# Patient Record
Sex: Male | Born: 1995 | Race: White | Hispanic: No | Marital: Single | Smoking: Never smoker
Health system: Southern US, Community
[De-identification: ages and names within clinical notes are randomized; demographics above are authoritative.]

## PROBLEM LIST (undated history)

## (undated) DIAGNOSIS — J45909 Unspecified asthma, uncomplicated: Secondary | ICD-10-CM

---
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
## 2018-07-28 ENCOUNTER — Emergency Department: Payer: Medicaid Other

## 2018-07-28 ENCOUNTER — Encounter: Payer: Self-pay | Admitting: Intensive Care

## 2018-07-28 ENCOUNTER — Other Ambulatory Visit: Payer: Self-pay

## 2018-07-28 ENCOUNTER — Emergency Department
Admission: EM | Admit: 2018-07-28 | Discharge: 2018-07-28 | Disposition: A | Discharge status: Home / Self Care | Payer: Medicaid Other | Attending: Student in an Organized Health Care Education/Training Program | Admitting: Student in an Organized Health Care Education/Training Program

## 2018-07-28 DIAGNOSIS — E860 Dehydration: Secondary | ICD-10-CM | POA: Diagnosis not present

## 2018-07-28 DIAGNOSIS — J45909 Unspecified asthma, uncomplicated: Secondary | ICD-10-CM | POA: Diagnosis not present

## 2018-07-28 DIAGNOSIS — R112 Nausea with vomiting, unspecified: Secondary | ICD-10-CM | POA: Diagnosis not present

## 2018-07-28 DIAGNOSIS — R1011 Right upper quadrant pain: Secondary | ICD-10-CM | POA: Diagnosis present

## 2018-07-28 LAB — CBC
HCT: 46.4 % (ref 39.0–52.0)
Hemoglobin: 15.5 g/dL (ref 13.0–17.0)
MCH: 29.8 pg (ref 26.0–34.0)
MCHC: 33.4 g/dL (ref 30.0–36.0)
MCV: 89.2 fL (ref 80.0–100.0)
Platelets: 286 10*3/uL (ref 150–400)
RBC: 5.2 MIL/uL (ref 4.22–5.81)
RDW: 12.1 % (ref 11.5–15.5)
WBC: 11.5 10*3/uL — ABNORMAL HIGH (ref 4.0–10.5)
nRBC: 0 % (ref 0.0–0.2)

## 2018-07-28 LAB — COMPREHENSIVE METABOLIC PANEL
ALT: 19 U/L (ref 0–44)
AST: 19 U/L (ref 15–41)
Albumin: 5.4 g/dL — ABNORMAL HIGH (ref 3.5–5.0)
Alkaline Phosphatase: 28 U/L — ABNORMAL LOW (ref 38–126)
Anion gap: 15 (ref 5–15)
BUN: 13 mg/dL (ref 6–20)
CO2: 21 mmol/L — ABNORMAL LOW (ref 22–32)
Calcium: 9.9 mg/dL (ref 8.9–10.3)
Chloride: 102 mmol/L (ref 98–111)
Creatinine, Ser: 0.84 mg/dL (ref 0.61–1.24)
GFR calc Af Amer: >60 mL/min (ref >60)
GFR calc non Af Amer: >60 mL/min (ref >60)
Glucose, Bld: 108 mg/dL — ABNORMAL HIGH (ref 70–99)
Potassium: 3.9 mmol/L (ref 3.5–5.1)
Sodium: 138 mmol/L (ref 135–145)
Total Bilirubin: 1.8 mg/dL — ABNORMAL HIGH (ref 0.3–1.2)
Total Protein: 8.9 g/dL — ABNORMAL HIGH (ref 6.5–8.1)

## 2018-07-28 LAB — URINALYSIS, COMPLETE (UACMP) WITH MICROSCOPIC
Bacteria, UA: NONE SEEN
Bilirubin Urine: NEGATIVE
Glucose, UA: NEGATIVE mg/dL
Hgb urine dipstick: NEGATIVE
Ketones, ur: 80 mg/dL — AB
Leukocytes,Ua: NEGATIVE
Nitrite: NEGATIVE
Protein, ur: 30 mg/dL — AB
Specific Gravity, Urine: 1.026 (ref 1.005–1.030)
pH: 6 (ref 5.0–8.0)

## 2018-07-28 LAB — CK: Total CK: 117 U/L (ref 49–397)

## 2018-07-28 LAB — LIPASE, BLOOD: Lipase: 27 U/L (ref 11–51)

## 2018-07-28 MED ORDER — ONDANSETRON HCL 4 MG/2ML IJ SOLN
4.0000 mg | Freq: Once | INTRAMUSCULAR | Status: AC
  Administered 2018-07-28: 4 mg via INTRAVENOUS
  Filled 2018-07-28: qty 2

## 2018-07-28 MED ORDER — PROMETHAZINE HCL 12.5 MG PO TABS: 12.5000 mg | ORAL_TABLET | Freq: Four times a day (QID) | ORAL | 0 refills | Status: AC | PRN

## 2018-07-28 MED ORDER — PROMETHAZINE HCL 25 MG/ML IJ SOLN
12.5000 mg | Freq: Four times a day (QID) | INTRAMUSCULAR | Status: DC | PRN
  Administered 2018-07-28: 12.5 mg via INTRAVENOUS
  Filled 2018-07-28: qty 1

## 2018-07-28 MED ORDER — SODIUM CHLORIDE 0.9 % IV BOLUS
1000.0000 mL | Freq: Once | INTRAVENOUS | Status: AC
  Administered 2018-07-28: 1000 mL via INTRAVENOUS

## 2018-07-28 MED ORDER — DEXTROSE 5 % AND 0.9 % NACL IV BOLUS
1000.0000 mL | Freq: Once | INTRAVENOUS | Status: AC
  Administered 2018-07-28: 1000 mL via INTRAVENOUS
  Filled 2018-07-28: qty 1000

## 2018-07-28 NOTE — ED Provider Notes (Signed)
[facility] Emergency Department Provider Note    First MD Initiated Contact with Patient 07/28/18 1357     (approximate)  I have reviewed the triage vital signs and the nursing notes.   HISTORY  Chief Complaint Abdominal Pain    HPI Terry Black is a 23 y.o. male presents the ER for nausea vomiting crampy abdominal pain.  Patient feels that he got "heat stroke "over the weekend.  Was working out in [employer].  Felt he got overheated and started having crampy abdominal pain and nausea.  He then tried again yesterday to work outside and started feeling worse.  Since then has been having trouble keeping anything down.  States he will have intermittently cramping abdominal pain that is periumbilical.  No radiation of pain denies any pain right now.  States he just feels very dehydrated.  Denies any significant past medical history.  Specifically does not have any right upper quadrant or right lower quadrant abdominal pain.  Has not taken anything for nausea at home.   Past Medical History:  Diagnosis Date  . Asthma    History reviewed. No pertinent family history. History reviewed. No pertinent surgical history. There are no active problems to display for this patient.     Prior to Admission medications   Medication Sig Start Date End Date Taking? Authorizing Provider  promethazine (PHENERGAN) 12.5 MG tablet Take 1 tablet (12.5 mg total) by mouth every 6 (six) hours as needed. 07/28/18   Merlyn Lot, MD    Allergies Amoxicillin    Social History Social History   Tobacco Use  . Smoking status: Never Smoker  . Smokeless tobacco: Never Used  Substance Use Topics  . Alcohol use: Never    Frequency: Never  . Drug use: Never    Review of Systems Patient denies headaches, rhinorrhea, blurry vision, numbness, shortness of breath, chest pain, edema, cough, abdominal pain, nausea, vomiting, diarrhea, dysuria, fevers, rashes or hallucinations  unless otherwise stated above in HPI. ____________________________________________   PHYSICAL EXAM:  VITAL SIGNS: Vitals:   07/28/18 1310 07/28/18 1400  BP:  (!) 143/81  Pulse:  61  Resp:    Temp: 98.7 F (37.1 C)   SpO2:  99%    Constitutional: Alert and oriented.  Eyes: Conjunctivae are normal.  Head: Atraumatic. Nose: No congestion/rhinnorhea. Mouth/Throat: Mucous membranes are moist.   Neck: No stridor. Painless ROM.  Cardiovascular: Normal rate, regular rhythm. Grossly normal heart sounds.  Good peripheral circulation. Respiratory: Normal respiratory effort.  No retractions. Lungs CTAB. Gastrointestinal: Soft and nontender. No distention. No abdominal bruits. No CVA tenderness. Genitourinary:  Musculoskeletal: No lower extremity tenderness nor edema.  No joint effusions. Neurologic:  Normal speech and language. No gross focal neurologic deficits are appreciated. No facial droop Skin:  Skin is warm, dry and intact. No rash noted. Psychiatric: Mood and affect are normal. Speech and behavior are normal.  ____________________________________________   LABS (all labs ordered are listed, but only abnormal results are displayed)  Results for orders placed or performed during the hospital encounter of 07/28/18 (from the past 24 hour(s))  Lipase, blood     Status: None   Collection Time: 07/28/18  1:08 PM  Result Value Ref Range   Lipase 27 11 - 51 U/L  Comprehensive metabolic panel     Status: Abnormal   Collection Time: 07/28/18  1:08 PM  Result Value Ref Range   Sodium 138 135 - 145 mmol/L   Potassium 3.9 3.5 -  5.1 mmol/L   Chloride 102 98 - 111 mmol/L   CO2 21 (L) 22 - 32 mmol/L   Glucose, Bld 108 (H) 70 - 99 mg/dL   BUN 13 6 - 20 mg/dL   Creatinine, Ser 9.600.84 0.61 - 1.24 mg/dL   Calcium 9.9 8.9 - 45.410.3 mg/dL   Total Protein 8.9 (H) 6.5 - 8.1 g/dL   Albumin 5.4 (H) 3.5 - 5.0 g/dL   AST 19 15 - 41 U/L   ALT 19 0 - 44 U/L   Alkaline Phosphatase 28 (L) 38 - 126 U/L    Total Bilirubin 1.8 (H) 0.3 - 1.2 mg/dL   GFR calc non Af Amer >60 >60 mL/min   GFR calc Af Amer >60 >60 mL/min   Anion gap 15 5 - 15  CBC     Status: Abnormal   Collection Time: 07/28/18  1:08 PM  Result Value Ref Range   WBC 11.5 (H) 4.0 - 10.5 K/uL   RBC 5.20 4.22 - 5.81 MIL/uL   Hemoglobin 15.5 13.0 - 17.0 g/dL   HCT 09.846.4 11.939.0 - 14.752.0 %   MCV 89.2 80.0 - 100.0 fL   MCH 29.8 26.0 - 34.0 pg   MCHC 33.4 30.0 - 36.0 g/dL   RDW 82.912.1 56.211.5 - 13.015.5 %   Platelets 286 150 - 400 K/uL   nRBC 0.0 0.0 - 0.2 %  Urinalysis, Complete w Microscopic     Status: Abnormal   Collection Time: 07/28/18  1:08 PM  Result Value Ref Range   Color, Urine YELLOW (A) YELLOW   APPearance CLEAR (A) CLEAR   Specific Gravity, Urine 1.026 1.005 - 1.030   pH 6.0 5.0 - 8.0   Glucose, UA NEGATIVE NEGATIVE mg/dL   Hgb urine dipstick NEGATIVE NEGATIVE   Bilirubin Urine NEGATIVE NEGATIVE   Ketones, ur 80 (A) NEGATIVE mg/dL   Protein, ur 30 (A) NEGATIVE mg/dL   Nitrite NEGATIVE NEGATIVE   Leukocytes,Ua NEGATIVE NEGATIVE   RBC / HPF 0-5 0 - 5 RBC/hpf   WBC, UA 0-5 0 - 5 WBC/hpf   Bacteria, UA NONE SEEN NONE SEEN   Squamous Epithelial / LPF 0-5 0 - 5   Mucus PRESENT   CK     Status: None   Collection Time: 07/28/18  1:08 PM  Result Value Ref Range   Total CK 117 49 - 397 U/L   ____________________________________________ ______________________________  RADIOLOGY  I personally reviewed all radiographic images ordered to evaluate for the above acute complaints and reviewed radiology reports and findings.  These findings were personally discussed with the patient.  Please see medical record for radiology report.  ____________________________________________   PROCEDURES  Procedure(s) performed:  Procedures    Critical Care performed: no ____________________________________________   INITIAL IMPRESSION / ASSESSMENT AND PLAN / ED COURSE  Pertinent labs & imaging results that were available during  my care of the patient were reviewed by me and considered in my medical decision making (see chart for details).   DDX: Dehydration, heat illness, DKA, electrolyte abnormality enteritis, cholecystitis, pancreatitis, appendicitis  Naeem T Algis GreenhouseDonathan is a 23 y.o. who presents to the ED with symptoms as described above.  Patient nontoxic afebrile hemodynamically stable but does appear dehydrated.  Blood work shows dehydration.  Will give IV fluids.  Also with ketones in his urine.  Right upper quadrant ultrasound will be ordered given his nausea as well as epigastric discomfort.  Does not seem consistent with appendicitis.  Will also evaluate mildly  elevated bilirubin.  Clinical Course as of Jul 28 1803  Tue Jul 28, 2018  1802 Patient rechecked.  Repeat abdominal exam is soft and benign.  He still having some nausea but no vomiting.  Feels significantly improved after IV fluids.  Ultrasound does not show any evidence of biliary pathology.  He is tolerating oral hydration.  At this point I do believe he stable and appropriate for outpatient follow-up.  Have discussed with the patient and available family all diagnostics and treatments performed thus far and all questions were answered to the best of my ability. The patient demonstrates understanding and agreement with plan.    [PR]    Clinical Course User Index [PR] Willy Eddyobinson, Nandan Willems, MD    The patient was evaluated in Emergency Department today for the symptoms described in the history of present illness. He/she was evaluated in the context of the global COVID-19 pandemic, which necessitated consideration that the patient might be at risk for infection with the SARS-CoV-2 virus that causes COVID-19. Institutional protocols and algorithms that pertain to the evaluation of patients at risk for COVID-19 are in a state of rapid change based on information released by regulatory bodies including the CDC and federal and state organizations. These policies and  algorithms were followed during the patient's care in the ED.  As part of my medical decision making, I reviewed the following data within the electronic MEDICAL RECORD NUMBER Nursing notes reviewed and incorporated, Labs reviewed, notes from prior ED visits and [employer] Controlled Substance Database   ____________________________________________   FINAL CLINICAL IMPRESSION(S) / ED DIAGNOSES  Final diagnoses:  Nausea and vomiting  Dehydration after exertion      NEW MEDICATIONS STARTED DURING THIS VISIT:  New Prescriptions   PROMETHAZINE (PHENERGAN) 12.5 MG TABLET    Take 1 tablet (12.5 mg total) by mouth every 6 (six) hours as needed.     Note:  This document was prepared using Dragon voice recognition software and may include unintentional dictation errors.    Willy Eddyobinson, Reshunda Strider, MD 07/28/18 [phone]

## 2018-07-28 NOTE — ED Notes (Signed)
Given fluids and encouraged pt to drink, will continue to monitor

## 2018-07-28 NOTE — ED Notes (Signed)
Pt worked outside in yard this afternoon xfew hours and states overheated and vomiting since. Pt appears fatigue. NAD noted

## 2018-07-28 NOTE — ED Notes (Signed)
Patient transported to Ultrasound 

## 2018-07-28 NOTE — ED Triage Notes (Signed)
Patient reports N/V, chills since Monday. Reports hes been working outside and has not felt himself since Monday. Some abd pain. Unable to keep fluids/solids down

## 2018-07-28 NOTE — ED Notes (Signed)
Pt keeping down fluids at this time, states he is feeling better after medications. Will continue to monitor

## 2019-11-23 ENCOUNTER — Ambulatory Visit
Admission: EM | Admit: 2019-11-23 | Discharge: 2019-11-23 | Disposition: A | Discharge status: Home / Self Care | Payer: Medicaid Other | Attending: Emergency Medicine | Admitting: Emergency Medicine

## 2019-11-23 ENCOUNTER — Other Ambulatory Visit: Payer: Self-pay

## 2019-11-23 ENCOUNTER — Telehealth (HOSPITAL_COMMUNITY): Payer: Self-pay | Admitting: Emergency Medicine

## 2019-11-23 DIAGNOSIS — R059 Cough, unspecified: Secondary | ICD-10-CM

## 2019-11-23 DIAGNOSIS — J0100 Acute maxillary sinusitis, unspecified: Secondary | ICD-10-CM | POA: Diagnosis not present

## 2019-11-23 MED ORDER — AZITHROMYCIN 250 MG PO TABS: 500.0000 mg | ORAL_TABLET | Freq: Once | ORAL | 0 refills | Status: AC

## 2019-11-23 MED ORDER — ONDANSETRON 4 MG PO TBDP: 4.0000 mg | ORAL_TABLET | Freq: Three times a day (TID) | ORAL | 0 refills | Status: AC | PRN

## 2019-11-23 MED ORDER — AZITHROMYCIN 250 MG PO TABS: 250.0000 mg | ORAL_TABLET | Freq: Every day | ORAL | 0 refills | Status: AC

## 2019-11-23 MED ORDER — BENZONATATE 100 MG PO CAPS: 100.0000 mg | ORAL_CAPSULE | Freq: Three times a day (TID) | ORAL | 0 refills | Status: AC

## 2019-11-23 NOTE — ED Triage Notes (Signed)
Pt reports having productive cough and chest congestion x2 weeks. Also reports having a headache. No known sick contacts.

## 2019-11-23 NOTE — Telephone Encounter (Signed)
Patient called with his mother (he verbalized permission for her to be on the call), and he states he vomited very shortly after taking his first dose of Azithromycin.  This Rn asked if he had eaten, patient states he has not been able to keep anything down for a few days.  Mother requested nausea medicine and replacement dose.  Spoke to [place], APP who approved, prescriptions sent to pharmacy, patient/mother updated

## 2019-11-23 NOTE — ED Provider Notes (Signed)
Renaldo Fiddler    CSN: [id number] Arrival date & time: 11/23/19  2119      History   Chief Complaint Chief Complaint  Patient presents with  . Cough    HPI Terry Black is a 24 y.o. male.   Patient presents with 2-week history of cough productive of yellow-green phlegm, sinus pressure, postnasal drip, rhinorrhea, and chest congestion.  He also reports a headache, vomiting of phlegm, mild diarrhea, and sore throat when he coughs.  He denies fever, chills, rash, shortness of breath, or other symptoms.  OTC treatment attempted at home.  His medical history includes asthma.  The history is provided by the patient.    Past Medical History:  Diagnosis Date  . Asthma     There are no problems to display for this patient.   History reviewed. No pertinent surgical history.     Home Medications    Prior to Admission medications   Medication Sig Start Date End Date Taking? Authorizing Provider  azithromycin (ZITHROMAX) 250 MG tablet Take 1 tablet (250 mg total) by mouth daily. Take first 2 tablets together, then 1 every day until finished. 11/23/19   Mickie Bail, NP  benzonatate (TESSALON) 100 MG capsule Take 1 capsule (100 mg total) by mouth every 8 (eight) hours. 11/23/19   Mickie Bail, NP  promethazine (PHENERGAN) 12.5 MG tablet Take 1 tablet (12.5 mg total) by mouth every 6 (six) hours as needed. 07/28/18   Willy Eddy, MD    Family History History reviewed. No pertinent family history.  Social History Social History   Tobacco Use  . Smoking status: Never Smoker  . Smokeless tobacco: Never Used  Substance Use Topics  . Alcohol use: Never  . Drug use: Never     Allergies   Amoxicillin and Augmentin [amoxicillin-pot clavulanate]   Review of Systems Review of Systems  Constitutional: Negative for chills and fever.  HENT: Positive for congestion, postnasal drip, rhinorrhea, sinus pressure and sore throat. Negative for ear pain.   Eyes:  Negative for pain and visual disturbance.  Respiratory: Positive for cough. Negative for shortness of breath.   Cardiovascular: Negative for chest pain and palpitations.  Gastrointestinal: Positive for diarrhea and vomiting. Negative for abdominal pain.  Genitourinary: Negative for dysuria and hematuria.  Musculoskeletal: Negative for arthralgias and back pain.  Skin: Negative for color change and rash.  Neurological: Negative for seizures and syncope.  All other systems reviewed and are negative.    Physical Exam Triage Vital Signs ED Triage Vitals  Enc Vitals Group     BP      Pulse      Resp      Temp      Temp src      SpO2      Weight      Height      Head Circumference      Peak Flow      Pain Score      Pain Loc      Pain Edu?      Excl. in GC?    No data found.  Updated Vital Signs BP 137/84   Pulse 83   Temp 99.1 F (37.3 C) (Oral)   Resp 16   Ht 6\' 6"  (1.981 m)   Wt 300 lb (136.1 kg)   SpO2 95%   BMI 34.67 kg/m   Visual Acuity Right Eye Distance:   Left Eye Distance:   Bilateral Distance:  Right Eye Near:   Left Eye Near:    Bilateral Near:     Physical Exam Vitals and nursing note reviewed.  Constitutional:      General: He is not in acute distress.    Appearance: He is well-developed.  HENT:     Head: Normocephalic and atraumatic.     Right Ear: Tympanic membrane normal.     Left Ear: Tympanic membrane normal.     Nose: Congestion and rhinorrhea present.     Mouth/Throat:     Mouth: Mucous membranes are moist.     Pharynx: Oropharynx is clear.  Eyes:     Conjunctiva/sclera: Conjunctivae normal.  Cardiovascular:     Rate and Rhythm: Normal rate and regular rhythm.     Heart sounds: Normal heart sounds.  Pulmonary:     Effort: Pulmonary effort is normal. No respiratory distress.     Breath sounds: Normal breath sounds.  Abdominal:     Palpations: Abdomen is soft.     Tenderness: There is no abdominal tenderness. There is no  guarding or rebound.  Musculoskeletal:     Cervical back: Neck supple.  Skin:    General: Skin is warm and dry.     Findings: No rash.  Neurological:     General: No focal deficit present.     Mental Status: He is alert and oriented to person, place, and time.     Gait: Gait normal.  Psychiatric:        Mood and Affect: Mood normal.        Behavior: Behavior normal.      UC Treatments / Results  Labs (all labs ordered are listed, but only abnormal results are displayed) Labs Reviewed  COVID-19, FLU A+B AND RSV    EKG   Radiology No results found.  Procedures Procedures (including critical care time)  Medications Ordered in UC Medications - No data to display  Initial Impression / Assessment and Plan / UC Course  I have reviewed the triage vital signs and the nursing notes.  Pertinent labs & imaging results that were available during my care of the patient were reviewed by me and considered in my medical decision making (see chart for details).   Acute sinusitis, cough.  Treating with Zithromax and Tessalon Perles.  Discussed other symptomatic treatment including Tylenol or ibuprofen, rest, hydration.  Influenza, RSV, COVID pending.  Instructed patient to self quarantine until the test result is back.  Instructed patient to follow up with PCP if his symptoms are not improving  Patient agrees to plan of care.    Final Clinical Impressions(s) / UC Diagnoses   Final diagnoses:  Acute non-recurrent maxillary sinusitis  Cough     Discharge Instructions     Take the Zithromax and Tessalon Perles as directed.    Your COVID and Flu tests are pending.  You should self quarantine until the test results are back.    Take Tylenol or ibuprofen as needed for fever or discomfort.  Rest and keep yourself hydrated.    Follow-up with your primary care provider if your symptoms are not improving.        ED Prescriptions    Medication Sig Dispense Auth. Provider    benzonatate (TESSALON) 100 MG capsule Take 1 capsule (100 mg total) by mouth every 8 (eight) hours. 21 capsule Mickie Bail, NP   azithromycin (ZITHROMAX) 250 MG tablet Take 1 tablet (250 mg total) by mouth daily. Take first 2 tablets together,  then 1 every day until finished. 6 tablet Mickie Bail, NP     PDMP not reviewed this encounter.   Mickie Bail, NP 11/23/19 1034

## 2019-11-23 NOTE — Discharge Instructions (Addendum)
Take the Zithromax and Tessalon Perles as directed.    Your COVID and Flu tests are pending.  You should self quarantine until the test results are back.    Take Tylenol or ibuprofen as needed for fever or discomfort.  Rest and keep yourself hydrated.    Follow-up with your primary care provider if your symptoms are not improving.

## 2019-11-24 LAB — COVID-19, FLU A+B AND RSV
Influenza A, NAA: NOT DETECTED
Influenza B, NAA: NOT DETECTED
RSV, NAA: NOT DETECTED
SARS-CoV-2, NAA: DETECTED — AB

## 2020-08-04 IMAGING — US ULTRASOUND ABDOMEN LIMITED
1 series · 14 of 25 positions shown · non-contrast
Comparison: None.

CLINICAL DATA: Nausea and vomiting.

EXAM:
ULTRASOUND ABDOMEN LIMITED RIGHT UPPER QUADRANT

[Series 1: ultrasound abdomen limited · 14 of 32 slices shown]
[im 1/32]
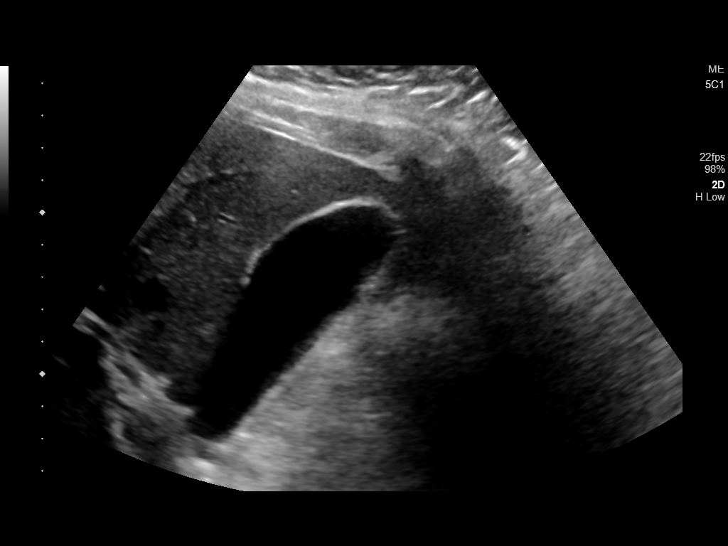
[im 3/32]
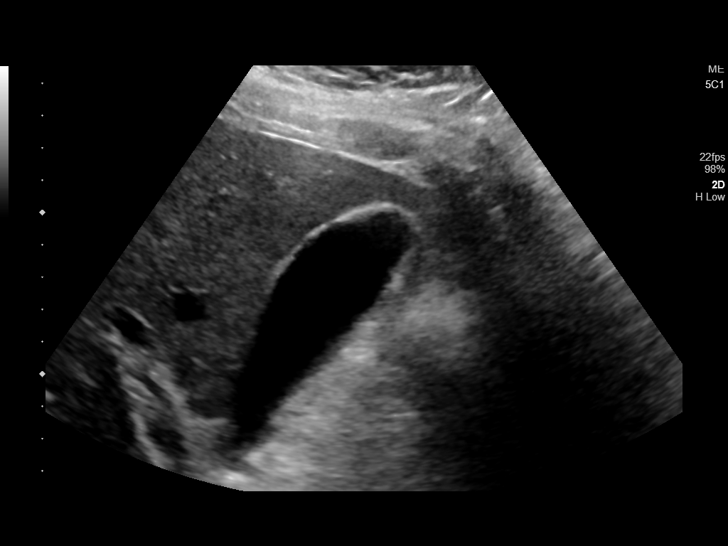
[im 6/32]
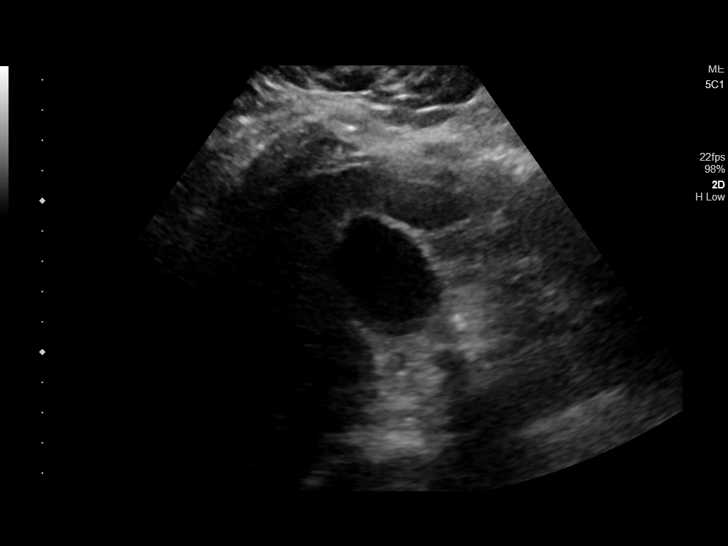
[im 8/32]
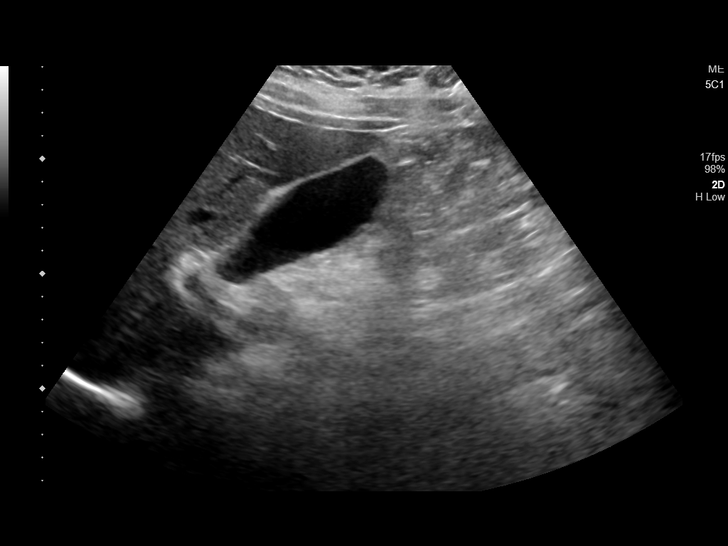
[im 11/32]
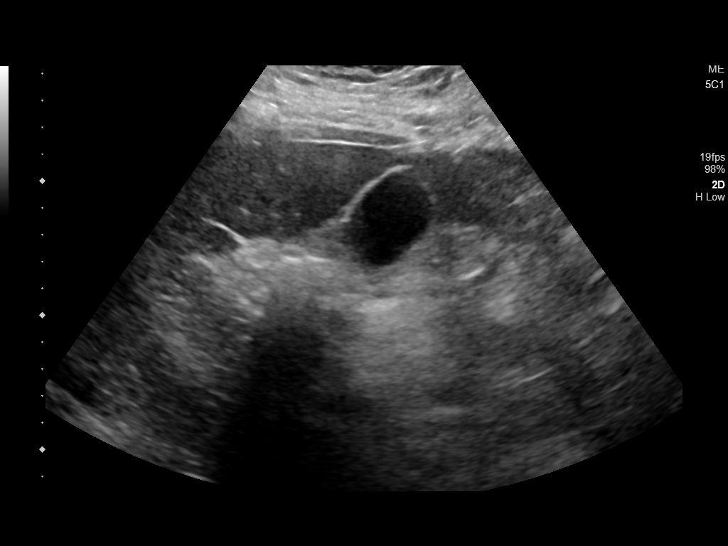
[im 12/32]
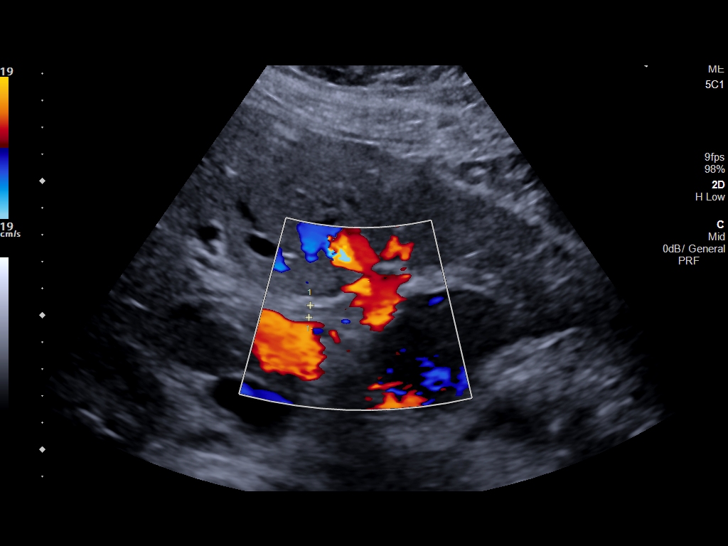
[im 15/32]
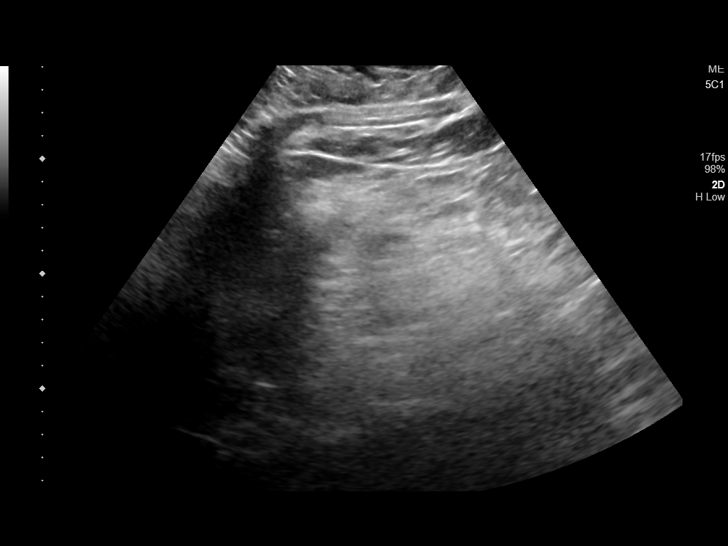
[im 17/32]
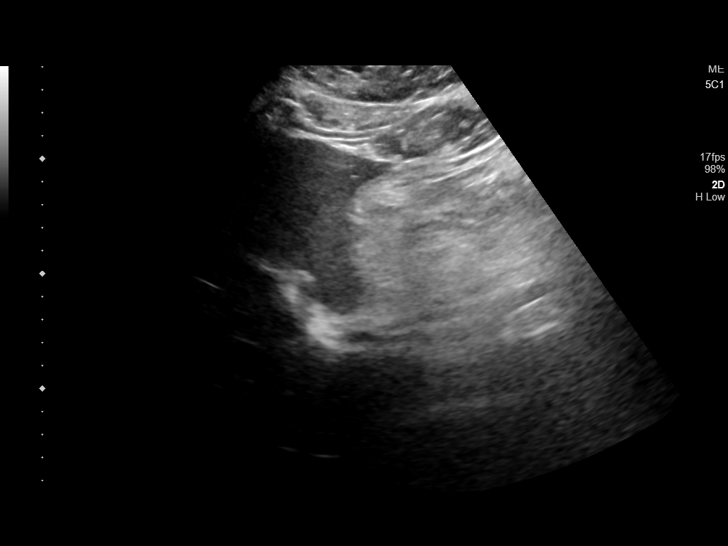
[im 20/32]
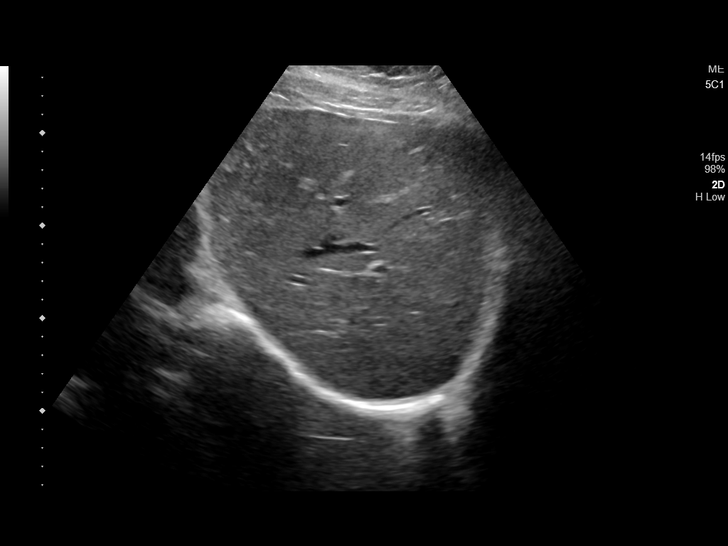
[im 21/32]
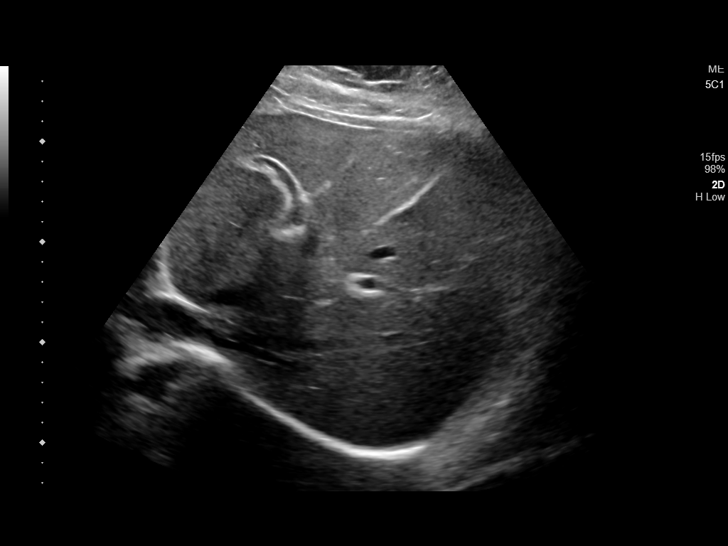
[im 24/32]
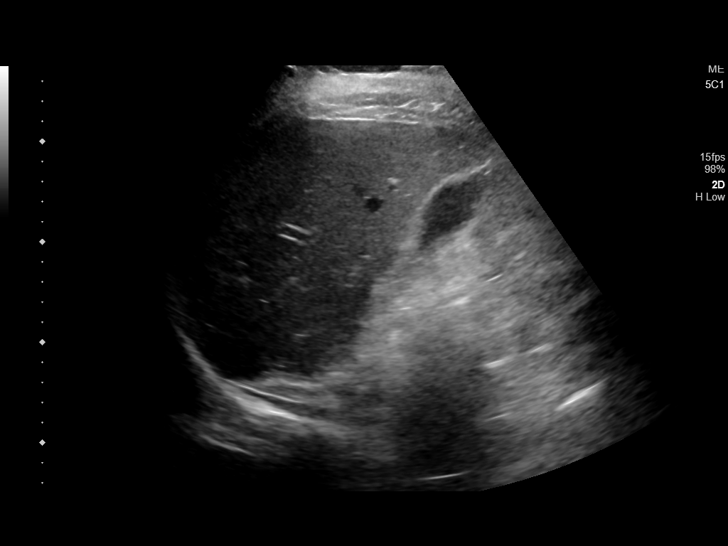
[im 26/32]
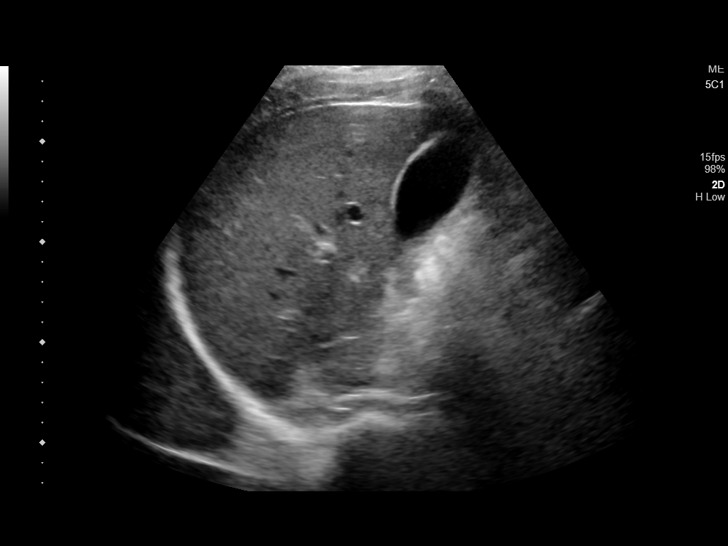
[im 29/32]
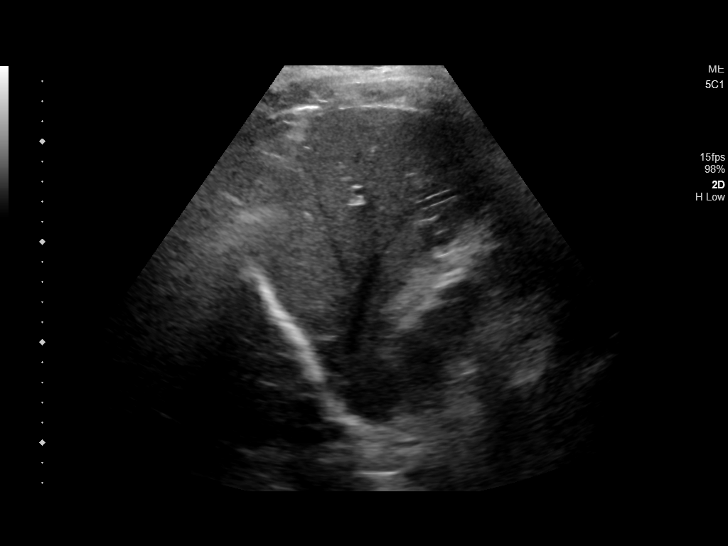
[im 32/32]
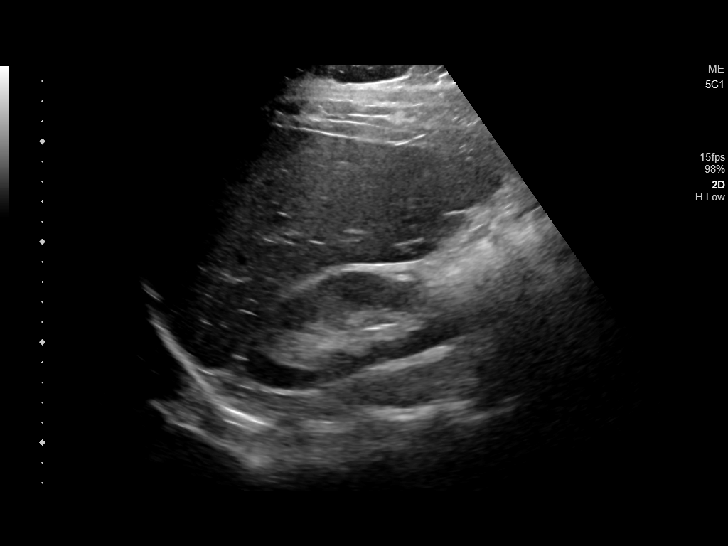

[14 of 25 positions shown; findings below may reference images not displayed]

FINDINGS: Gallbladder:

No gallstones or wall thickening visualized. No sonographic Murphy
sign noted by sonographer.

Common bile duct:

Diameter: 4.5 mm.

Liver:

No focal lesion identified. Within normal limits in parenchymal
echogenicity. Portal vein is patent on color Doppler imaging with
normal direction of blood flow towards the liver.
IMPRESSION: Normal right upper quadrant ultrasound.
